# Patient Record
Sex: Female | Born: 1970 | Hispanic: Yes | Marital: Married | Smoking: Never smoker
Health system: Southern US, Community
[De-identification: ages and names within clinical notes are randomized; demographics above are authoritative.]

## PROBLEM LIST (undated history)

## (undated) DIAGNOSIS — I10 Essential (primary) hypertension: Secondary | ICD-10-CM

## (undated) DIAGNOSIS — E119 Type 2 diabetes mellitus without complications: Secondary | ICD-10-CM

---

## 2008-02-24 ENCOUNTER — Emergency Department: Payer: Self-pay | Admitting: Internal Medicine

## 2016-02-06 ENCOUNTER — Encounter: Payer: Self-pay | Admitting: *Deleted

## 2016-02-06 ENCOUNTER — Ambulatory Visit
Admission: RE | Admit: 2016-02-06 | Discharge: 2016-02-06 | Disposition: A | Discharge status: Home / Self Care | Payer: Self-pay | Source: Ambulatory Visit | Attending: Oncology | Admitting: Oncology

## 2016-02-06 ENCOUNTER — Ambulatory Visit: Payer: Self-pay | Attending: Oncology | Admitting: *Deleted

## 2016-02-06 VITALS — BP 110/72 | HR 75 | Temp 98.2°F | Ht 62.99 in | Wt 220.7 lb

## 2016-02-06 DIAGNOSIS — Z0000 Encounter for general adult medical examination without abnormal findings: Secondary | ICD-10-CM

## 2016-02-06 NOTE — Patient Instructions (Signed)
Gave patient hand-out, Women Staying Healthy, Active and Well from [facility], with education on breast health, pap smears, heart and colon health. 

## 2016-02-06 NOTE — Progress Notes (Signed)
Subjective:     Patient ID: Crystal Sawyer, female   DOB: 08/01/1970, 45 y.o.   MRN: [id number]  HPI   Review of Systems     Objective:   Physical Exam  Pulmonary/Chest: Edema: bilateral inverted nipples noted - patient states normal for her. Right breast exhibits inverted nipple. Right breast exhibits no mass, no nipple discharge, no skin change and no tenderness. Left breast exhibits inverted nipple. Left breast exhibits no mass, no nipple discharge, no skin change and no tenderness. Breasts are symmetrical.    Abdominal: There is no splenomegaly or hepatomegaly.       Assessment:     45 [facility] Hispanic [facility].  Pelvic exam omitted since she is still spotting from her menstrual cycle.  Lloyda the interpreter present during the interview and exam.  Clinical breast exam unremarkable.  Taught self breast awareness.  Patient has been screened for eligibility.  She does not have any insurance, Medicare or Medicaid.  She also meets financial eligibility.  Hand-out given on the Affordable Care Act.    Plan:     Screening mammogram ordered.  Will follow-up per [facility] protocol.

## 2016-02-08 ENCOUNTER — Encounter: Payer: Self-pay | Admitting: *Deleted

## 2016-02-08 NOTE — Progress Notes (Signed)
Letter mailed to inform patient of her normal mammogram results.  She is to follow-up in one year with annual screening.  HSIS to Christy. 

## 2017-01-28 ENCOUNTER — Ambulatory Visit: Payer: Self-pay

## 2017-02-28 ENCOUNTER — Encounter: Payer: Self-pay | Admitting: Emergency Medicine

## 2017-02-28 ENCOUNTER — Emergency Department
Admission: EM | Admit: 2017-02-28 | Discharge: 2017-03-01 | Disposition: A | Discharge status: Home / Self Care | Payer: No Typology Code available for payment source | Attending: Emergency Medicine | Admitting: Emergency Medicine

## 2017-02-28 DIAGNOSIS — R52 Pain, unspecified: Secondary | ICD-10-CM

## 2017-02-28 DIAGNOSIS — S0990XA Unspecified injury of head, initial encounter: Secondary | ICD-10-CM | POA: Insufficient documentation

## 2017-02-28 DIAGNOSIS — M542 Cervicalgia: Secondary | ICD-10-CM | POA: Insufficient documentation

## 2017-02-28 DIAGNOSIS — Y939 Activity, unspecified: Secondary | ICD-10-CM | POA: Diagnosis not present

## 2017-02-28 DIAGNOSIS — S2241XA Multiple fractures of ribs, right side, initial encounter for closed fracture: Secondary | ICD-10-CM | POA: Diagnosis not present

## 2017-02-28 DIAGNOSIS — R1011 Right upper quadrant pain: Secondary | ICD-10-CM | POA: Insufficient documentation

## 2017-02-28 DIAGNOSIS — Y929 Unspecified place or not applicable: Secondary | ICD-10-CM | POA: Diagnosis not present

## 2017-02-28 DIAGNOSIS — Y998 Other external cause status: Secondary | ICD-10-CM | POA: Insufficient documentation

## 2017-02-28 DIAGNOSIS — M25511 Pain in right shoulder: Secondary | ICD-10-CM | POA: Insufficient documentation

## 2017-02-28 DIAGNOSIS — E119 Type 2 diabetes mellitus without complications: Secondary | ICD-10-CM | POA: Diagnosis not present

## 2017-02-28 DIAGNOSIS — I10 Essential (primary) hypertension: Secondary | ICD-10-CM | POA: Insufficient documentation

## 2017-02-28 DIAGNOSIS — S299XXA Unspecified injury of thorax, initial encounter: Secondary | ICD-10-CM | POA: Diagnosis present

## 2017-02-28 HISTORY — DX: Type 2 diabetes mellitus without complications: E11.9

## 2017-02-28 HISTORY — DX: Essential (primary) hypertension: I10

## 2017-02-28 MED ORDER — FENTANYL CITRATE (PF) 100 MCG/2ML IJ SOLN
75.0000 ug | Freq: Once | INTRAMUSCULAR | Status: AC
  Administered 2017-03-01: 75 ug via INTRAVENOUS
  Filled 2017-02-28: qty 2
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
## 2017-03-01 ENCOUNTER — Encounter: Payer: Self-pay | Admitting: Radiology

## 2017-03-01 ENCOUNTER — Emergency Department: Payer: No Typology Code available for payment source

## 2017-03-01 LAB — CBC WITH DIFFERENTIAL/PLATELET
BASOS PCT: 0 %
Basophils Absolute: 0.1 10*3/uL (ref 0–0.1)
Eosinophils Absolute: 0.1 10*3/uL (ref 0–0.7)
Eosinophils Relative: 1 %
HEMATOCRIT: 43.5 % (ref 35.0–47.0)
HEMOGLOBIN: 13.9 g/dL (ref 12.0–16.0)
Lymphocytes Relative: 31 %
Lymphs Abs: 3.9 10*3/uL — ABNORMAL HIGH (ref 1.0–3.6)
MCH: 27.7 pg (ref 26.0–34.0)
MCHC: 32.1 g/dL (ref 32.0–36.0)
MCV: 86.2 fL (ref 80.0–100.0)
MONOS PCT: 5 %
Monocytes Absolute: 0.7 10*3/uL (ref 0.2–0.9)
NEUTROS ABS: 7.8 10*3/uL — AB (ref 1.4–6.5)
NEUTROS PCT: 63 %
Platelets: 339 10*3/uL (ref 150–440)
RBC: 5.04 MIL/uL (ref 3.80–5.20)
RDW: 14.8 % — ABNORMAL HIGH (ref 11.5–14.5)
WBC: 12.5 10*3/uL — ABNORMAL HIGH (ref 3.6–11.0)

## 2017-03-01 LAB — COMPREHENSIVE METABOLIC PANEL
ALT: 22 U/L (ref 14–54)
ANION GAP: 8 (ref 5–15)
AST: 30 U/L (ref 15–41)
Albumin: 3.7 g/dL (ref 3.5–5.0)
Alkaline Phosphatase: 94 U/L (ref 38–126)
BUN: 20 mg/dL (ref 6–20)
CALCIUM: 8.8 mg/dL — AB (ref 8.9–10.3)
CHLORIDE: 105 mmol/L (ref 101–111)
CO2: 22 mmol/L (ref 22–32)
Creatinine, Ser: 0.56 mg/dL (ref 0.44–1.00)
GFR calc non Af Amer: >60 mL/min (ref >60)
Glucose, Bld: 242 mg/dL — ABNORMAL HIGH (ref 65–99)
POTASSIUM: 3.5 mmol/L (ref 3.5–5.1)
SODIUM: 135 mmol/L (ref 135–145)
Total Bilirubin: 0.5 mg/dL (ref 0.3–1.2)
Total Protein: 7.8 g/dL (ref 6.5–8.1)

## 2017-03-01 LAB — HCG, QUANTITATIVE, PREGNANCY: hCG, Beta Chain, Quant, S: <1 m[IU]/mL (ref <5)

## 2017-03-01 MED ORDER — IBUPROFEN 600 MG PO TABS: 600.0000 mg | ORAL_TABLET | Freq: Three times a day (TID) | ORAL | 0 refills | Status: AC | PRN

## 2017-03-01 MED ORDER — HYDROCODONE-ACETAMINOPHEN 5-325 MG PO TABS
1.0000 | ORAL_TABLET | Freq: Once | ORAL | Status: AC
  Administered 2017-03-01: 1 via ORAL
  Filled 2017-03-01: qty 1

## 2017-03-01 MED ORDER — KETOROLAC TROMETHAMINE 30 MG/ML IJ SOLN
15.0000 mg | Freq: Once | INTRAMUSCULAR | Status: AC
  Administered 2017-03-01: 15 mg via INTRAVENOUS
  Filled 2017-03-01: qty 1

## 2017-03-01 MED ORDER — IOPAMIDOL (ISOVUE-370) INJECTION 76%
75.0000 mL | Freq: Once | INTRAVENOUS | Status: AC | PRN
  Administered 2017-03-01: 75 mL via INTRAVENOUS

## 2017-03-01 MED ORDER — HYDROCODONE-ACETAMINOPHEN 5-325 MG PO TABS: 1.0000 | ORAL_TABLET | Freq: Four times a day (QID) | ORAL | 0 refills | Status: AC | PRN

## 2017-03-01 NOTE — ED Notes (Signed)
From 2351 until this time, "interpreter on a stick" isn't working, machine keeps saying "thank you for calling [employer], please hold"

## 2017-03-01 NOTE — ED Notes (Signed)
Patient transported to CT 
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
## 2017-03-01 NOTE — ED Notes (Addendum)
Recall note to transport
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
## 2017-04-01 ENCOUNTER — Ambulatory Visit: Payer: No Typology Code available for payment source | Attending: Oncology

## 2018-04-14 ENCOUNTER — Ambulatory Visit: Payer: Self-pay

## 2018-07-28 ENCOUNTER — Ambulatory Visit: Payer: Self-pay

## 2018-09-13 ENCOUNTER — Telehealth: Payer: Self-pay | Admitting: *Deleted

## 2018-09-13 DIAGNOSIS — Z20822 Contact with and (suspected) exposure to covid-19: Secondary | ICD-10-CM

## 2018-09-13 NOTE — Telephone Encounter (Signed)
Pt referred for COVID-19 testing by [facility] Department.

## 2018-09-13 NOTE — Addendum Note (Signed)
Addended by: Dimple Nanas on: 09/13/2018 01:52 PM   Modules accepted: Orders

## 2018-09-13 NOTE — Telephone Encounter (Signed)
Appt scheduled using [place] ID# [phone] via pt's daughter Crystal Sawyer. Appt on 09/14/18 at the [facility] location.Advised to wear a mask and remain in car at appt time. Understanding verbalized.

## 2018-09-14 ENCOUNTER — Other Ambulatory Visit: Payer: Self-pay

## 2018-09-14 DIAGNOSIS — Z20822 Contact with and (suspected) exposure to covid-19: Secondary | ICD-10-CM

## 2018-09-19 LAB — NOVEL CORONAVIRUS, NAA: SARS-CoV-2, NAA: NOT DETECTED

## 2018-09-20 NOTE — Telephone Encounter (Signed)
Negative result was given to pt °

## 2018-09-22 ENCOUNTER — Telehealth: Payer: Self-pay | Admitting: Family Medicine

## 2018-09-22 NOTE — Telephone Encounter (Signed)
Attempted to reach pt with assist of interpreter, no VM set up.

## 2019-11-02 ENCOUNTER — Encounter: Payer: Self-pay | Admitting: *Deleted

## 2019-11-02 ENCOUNTER — Ambulatory Visit
Admission: RE | Admit: 2019-11-02 | Discharge: 2019-11-02 | Disposition: A | Discharge status: Home / Self Care | Payer: Self-pay | Source: Ambulatory Visit | Attending: Oncology | Admitting: Oncology

## 2019-11-02 ENCOUNTER — Other Ambulatory Visit: Payer: Self-pay

## 2019-11-02 ENCOUNTER — Ambulatory Visit: Payer: Self-pay

## 2019-11-02 ENCOUNTER — Ambulatory Visit: Payer: Self-pay | Attending: Oncology | Admitting: *Deleted

## 2019-11-02 VITALS — BP 133/90 | HR 78 | Temp 98.0°F | Ht 61.5 in | Wt 202.0 lb

## 2019-11-02 DIAGNOSIS — Z0000 Encounter for general adult medical examination without abnormal findings: Secondary | ICD-10-CM | POA: Insufficient documentation

## 2019-11-02 NOTE — Patient Instructions (Signed)
Gave patient hand-out, Women Staying Healthy, Active and Well from [facility], with education on breast health, pap smears, heart and colon health. 

## 2019-11-02 NOTE — Progress Notes (Signed)
  Subjective:     Patient ID: Crystal Sawyer, female   DOB: December 18, 1970, 49 y.o.   MRN: [id number]  HPI   [facility] Medical History Record - 11/02/19 0914      Breast History   Screening cycle Rescreen    CBE Date 02/06/16    Provider (CBE) [facility]    Initial Mammogram 11/02/19    Last Mammogram Annual    Last Mammogram Date 02/06/16    Provider (Mammogram)  Delford Field    Recent Breast Symptoms None      Breast Cancer History   Breast Cancer History No personal or family history      Previous History of Breast Problems   Breast Surgery or Biopsy None    Breast Implants N/A    BSE Done Monthly      Gynecological/Obstetrical History   LMP 10/02/19    Is there any chance that the client could be pregnant?  No    Age at menarche 49    Age at menopause n/a    PAP smear history Annually    Date of last PAP  07/23/17    Provider (PAP) Phine[facility] Real    Age at first live birth 49    Breast fed children No    DES Exposure Unkown    Cervical, Uterine or Ovarian cancer No    Family history of Cervial, Uterine or Ovarian cancer No    Hysterectomy No    Cervix removed No    Ovaries removed No    Laser/Cryosurgery No    Current method of birth control --   Copper IUD   Current method of Estrogen/Hormone replacement None    Smoking history None    Comments No Ins / 5 in [facility] / $1123/wk            Review of Systems     Objective:   Physical Exam Chest:     Breasts:        Right: No swelling, bleeding, inverted nipple, mass, nipple discharge, skin change or tenderness.        Left: No swelling, bleeding, inverted nipple, mass, nipple discharge, skin change or tenderness.    Lymphadenopathy:     Upper Body:     Right upper body: No supraclavicular or axillary adenopathy.     Left upper body: No supraclavicular or axillary adenopathy.        Assessment:     49 year old Hispanic female returns to [facility] for clinical breast exam and mammogram.  Crystal Sawyer, the interpreter  present during the interview and exam.  Clinical breast exam reveals bilateral inverted nipples.  Patient states this is normal for her. States she was not able to breast feed due to the inverted nipples.   Taught self breast awareness.  Last pap on 07/23/17 was negative without HPV co-testing.  Next pap due in 2022.  Patient has been screened for eligibility.  She does not have any insurance, Medicare or Medicaid.  She also meets financial eligibility.   Risk Assessment    Risk Scores      11/02/2019   Last edited by: Alta Corning, CMA   5-year risk: 0.8 %   Lifetime risk: 7.9 %            Plan:     Screening mammogram ordered.  Will follow up per [facility] protocol.

## 2019-11-03 ENCOUNTER — Encounter: Payer: Self-pay | Admitting: *Deleted

## 2019-11-03 NOTE — Progress Notes (Unsigned)
Letter mailed from the [facility] to inform patient of her normal mammogram results.  Patient is to follow-up with annual screening in one year. 

## 2021-04-23 ENCOUNTER — Other Ambulatory Visit: Payer: Self-pay

## 2021-04-23 DIAGNOSIS — Z1231 Encounter for screening mammogram for malignant neoplasm of breast: Secondary | ICD-10-CM

## 2021-05-21 ENCOUNTER — Ambulatory Visit: Payer: Self-pay

## 2021-08-19 ENCOUNTER — Other Ambulatory Visit: Payer: Self-pay

## 2021-08-19 ENCOUNTER — Encounter: Payer: Self-pay | Admitting: Primary Care

## 2021-08-19 DIAGNOSIS — Z1211 Encounter for screening for malignant neoplasm of colon: Secondary | ICD-10-CM

## 2021-08-28 ENCOUNTER — Ambulatory Visit: Payer: Self-pay | Attending: Hematology and Oncology | Admitting: *Deleted

## 2021-08-28 ENCOUNTER — Ambulatory Visit
Admission: RE | Admit: 2021-08-28 | Discharge: 2021-08-28 | Disposition: A | Discharge status: Home / Self Care | Payer: Self-pay | Source: Ambulatory Visit | Attending: Obstetrics and Gynecology | Admitting: Obstetrics and Gynecology

## 2021-08-28 VITALS — BP 155/92 | Wt 217.9 lb

## 2021-08-28 DIAGNOSIS — Z1239 Encounter for other screening for malignant neoplasm of breast: Secondary | ICD-10-CM

## 2021-08-28 DIAGNOSIS — Z1231 Encounter for screening mammogram for malignant neoplasm of breast: Secondary | ICD-10-CM | POA: Insufficient documentation

## 2021-08-28 NOTE — Progress Notes (Signed)
Ms. Crystal Sawyer is a 51 y.o. female who presents to [facility] clinic today with no complaints.  Pap Smear: Pap smear not completed today. Last Pap smear was 03/27/2021 at [facility] clinic and was normal per patient. Per patient has history of an abnormal Pap smear in 2008 that a colposcopy was completed for follow up 07/01/2006 that showed HPV effect. Last Pap smear result is not available in Epic.   Physical exam: Breasts Breasts symmetrical. No skin abnormalities bilateral breasts. Nipple retraction bilateral breasts that per patient is normal for her. No nipple discharge bilateral breasts. No lymphadenopathy. No lumps palpated bilateral breasts. No complaints of pain or tenderness on exam.    MS DIGITAL SCREENING TOMO BILATERAL  Result Date: 11/03/2019 CLINICAL DATA:  Screening. EXAM: DIGITAL SCREENING BILATERAL MAMMOGRAM WITH TOMO AND CAD COMPARISON:  None. ACR Breast Density Category a: The breast tissue is almost entirely fatty. FINDINGS: There are no findings suspicious for malignancy. Images were processed with CAD. IMPRESSION: No mammographic evidence of malignancy. A result letter of this screening mammogram will be mailed directly to the patient. RECOMMENDATION: Screening mammogram in one year. (Code:SM-B-01Y) BI-RADS CATEGORY  1: Negative. Electronically Signed   By: Frederico Hamman M.D.   On: 11/03/2019 14:46     Pelvic/Bimanual Pap is not indicated today per [facility] guidelines.   Smoking History: Patient has never smoked.   Patient Navigation: Patient education provided. Access to services provided for patient through [employer] program. Spanish interpreter Delos Haring from [facility] provided.   Colorectal Cancer Screening: Per patient had a colonoscopy completed in 2013. FIT Test given to patient to complete. No complaints today.    Breast and Cervical Cancer Risk Assessment: Patient does not have family history of breast cancer, known genetic mutations, or  radiation treatment to the chest before age 31. Patient has history of cervical dysplasia. Patient has no history of being immunocompromised or DES exposure in-utero.  Risk Assessment     Risk Scores       08/28/2021 11/02/2019   Last edited by: Narda Rutherford, LPN [place], [place], [place]   5-year risk: 0.8 % 0.8 %   Lifetime risk: 7.7 % 7.9 %            A: [facility] exam without pap smear No complaints.  P: Referred patient to the [facility] for a screening mammogram. Appointment scheduled Wednesday, August 28, 2021 at 1240.  Priscille Heidelberg, RN 08/28/2021 12:09 PM

## 2021-08-28 NOTE — Patient Instructions (Signed)
Explained breast self awareness with Dimas Millin. Patient did not need a Pap smear today due to last Pap smear was 03/27/2021 per patient. Let her know [facility] will cover Pap smears every 3 years unless has a history of abnormal Pap smears. Referred patient to the [facility] for a screening mammogram. Appointment scheduled Wednesday, August 28, 2021 at 1240. Patient aware of appointment and will be there. Let patient know Delford Field will follow up with her within the next couple weeks with results of her mammogram by letter or phone. Diem Haliey Romberg verbalized understanding.  Gursimran Litaker, Kathaleen Maser, RN 12:09 PM

## 2021-08-28 NOTE — Addendum Note (Signed)
Addended by: Narda Rutherford on: 08/28/2021 12:07 PM   Modules accepted: Orders

## 2022-11-05 ENCOUNTER — Other Ambulatory Visit: Payer: Self-pay

## 2022-11-05 ENCOUNTER — Other Ambulatory Visit (LOCAL_COMMUNITY_HEALTH_CENTER): Payer: Self-pay

## 2022-11-05 DIAGNOSIS — Z201 Contact with and (suspected) exposure to tuberculosis: Secondary | ICD-10-CM

## 2022-11-05 LAB — HM HIV SCREENING LAB: HM HIV Screening: NEGATIVE

## 2022-11-05 NOTE — Progress Notes (Signed)
Patient is a 52 yo female being evaluated for TB.  Patient is a close contact to an active pulmonary TB case. QFT and HIV drawn today.   11/05/2022 - QFT pending 11/05/2022 - HIV Pending 11/05/2022 - EPI completed     EPI completed today. Patient states she is asymptomatic, has HTN, DM and high cholesterol. Takes lisinopril, metformin, and Jardiance.    PCP:  Phineas Real [facility]

## 2022-11-09 LAB — QUANTIFERON-TB GOLD PLUS
QuantiFERON Mitogen Value: >10 [IU]/mL
QuantiFERON Nil Value: 0.13 [IU]/mL
QuantiFERON TB1 Ag Value: 0.17 [IU]/mL
QuantiFERON TB2 Ag Value: 0.14 [IU]/mL
QuantiFERON-TB Gold Plus: NEGATIVE

## 2023-02-24 ENCOUNTER — Other Ambulatory Visit: Payer: Self-pay

## 2023-02-24 DIAGNOSIS — Z111 Encounter for screening for respiratory tuberculosis: Secondary | ICD-10-CM

## 2023-02-24 NOTE — Progress Notes (Signed)
Patient in clinic for repeat QFT. Contact to TB. Richmond Campbell, RN

## 2023-02-28 LAB — QUANTIFERON-TB GOLD PLUS
QuantiFERON Mitogen Value: >10 [IU]/mL
QuantiFERON Nil Value: 0.13 [IU]/mL
QuantiFERON TB1 Ag Value: 0.13 [IU]/mL
QuantiFERON TB2 Ag Value: 0.13 [IU]/mL
QuantiFERON-TB Gold Plus: NEGATIVE

## 2023-08-15 IMAGING — MG MM DIGITAL SCREENING BILAT W/ TOMO AND CAD
8 series · 8 of 24 positions shown · non-contrast
Comparison: Previous exam(s).

ACR Breast Density Category a: The breast tissue is almost entirely
fatty.

CLINICAL DATA: Screening.

EXAM:
DIGITAL SCREENING BILATERAL MAMMOGRAM WITH TOMOSYNTHESIS AND CAD
TECHNIQUE: Bilateral screening digital craniocaudal and mediolateral oblique
mammograms were obtained. Bilateral screening digital breast
tomosynthesis was performed. The images were evaluated with
computer-aided detection.

[L MLO synth-2D]
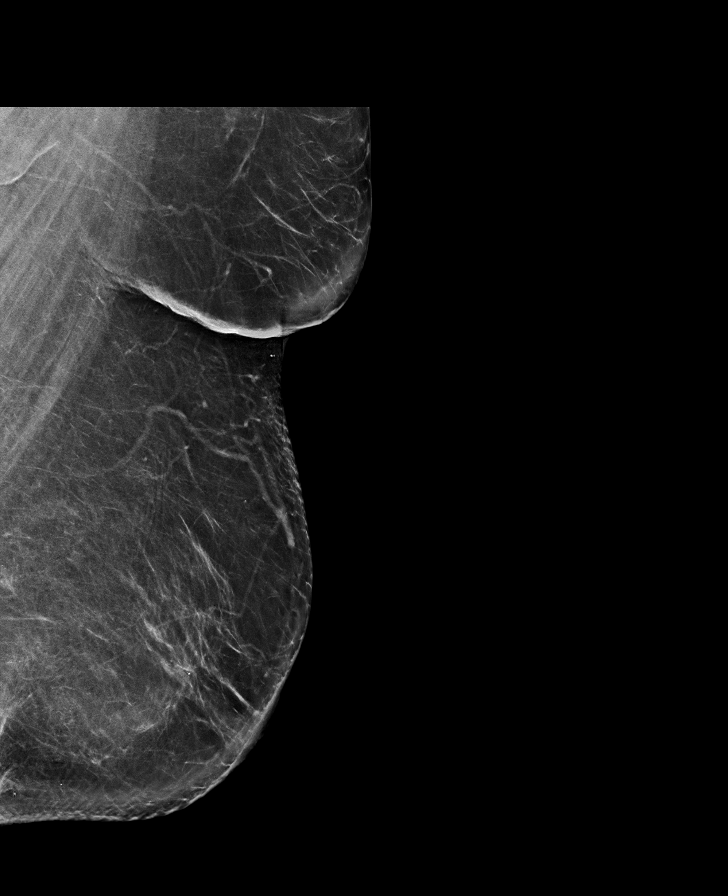

[R CC synth-2D]
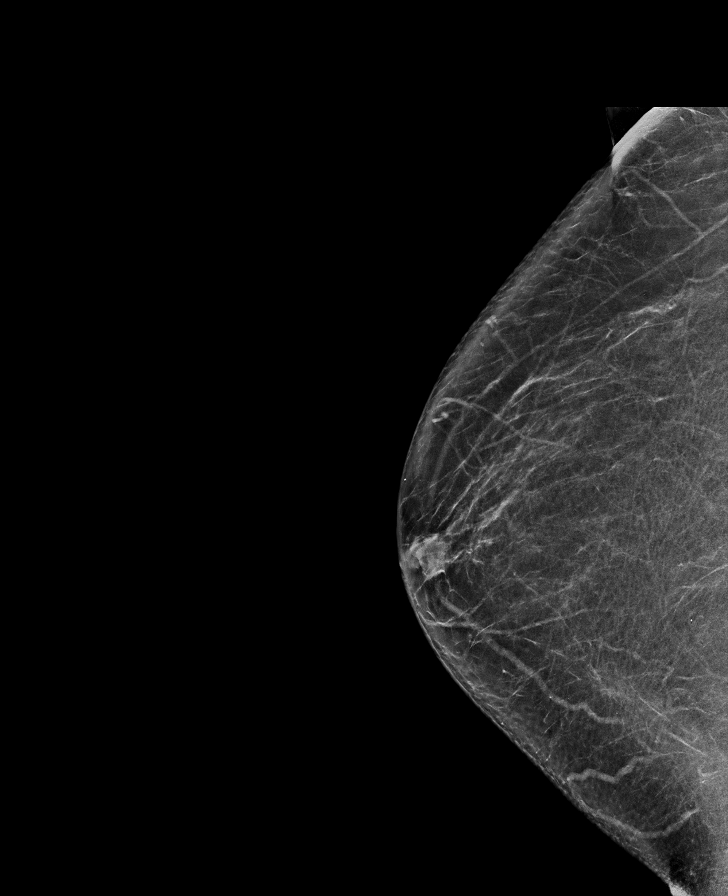

[L CC synth-2D]
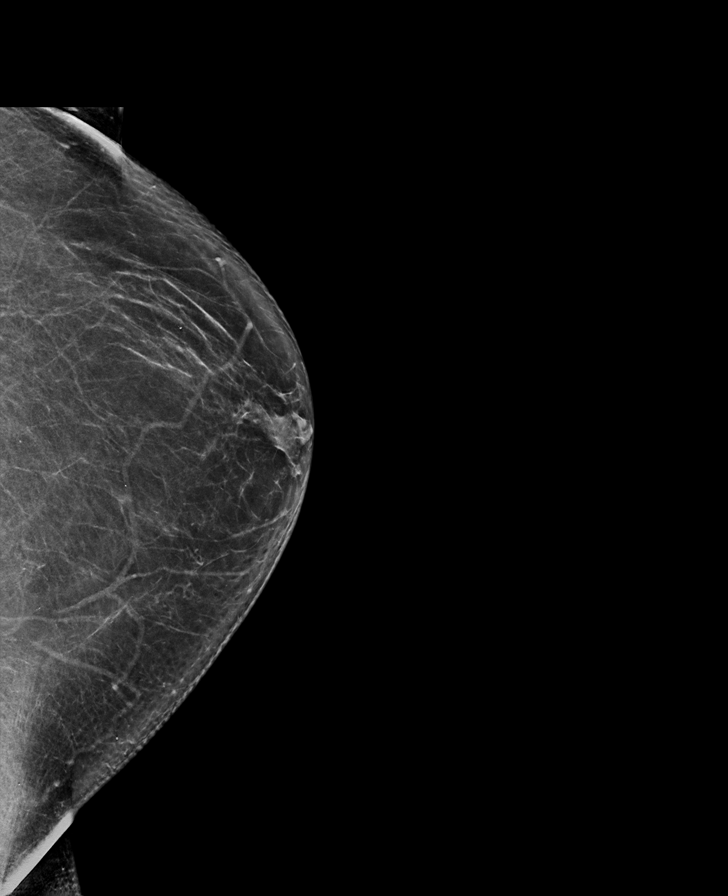

[R MLO synth-2D]
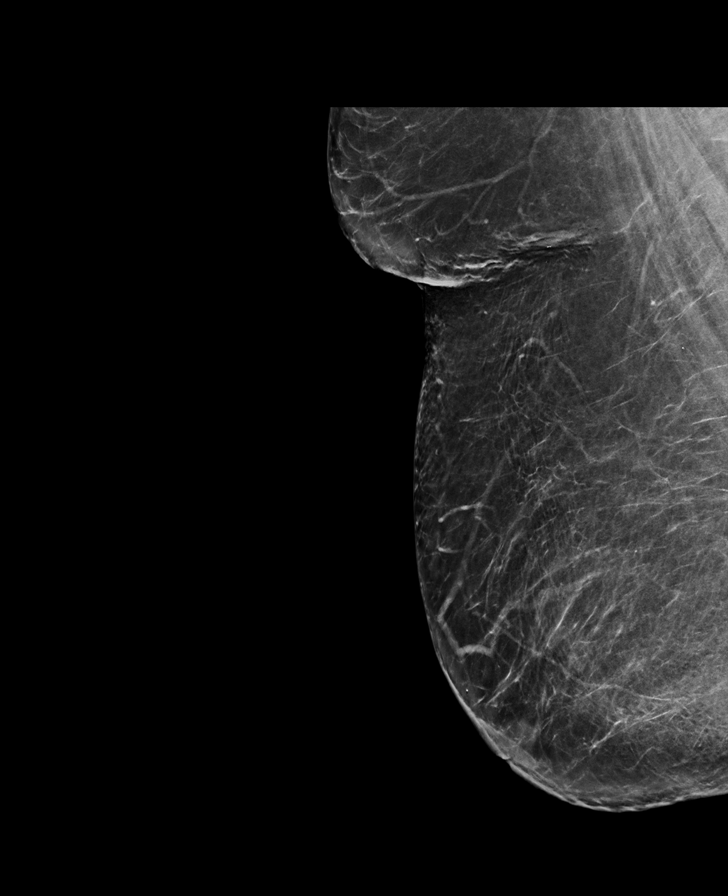

[L CC tomo · tomo slice 41/81.0]
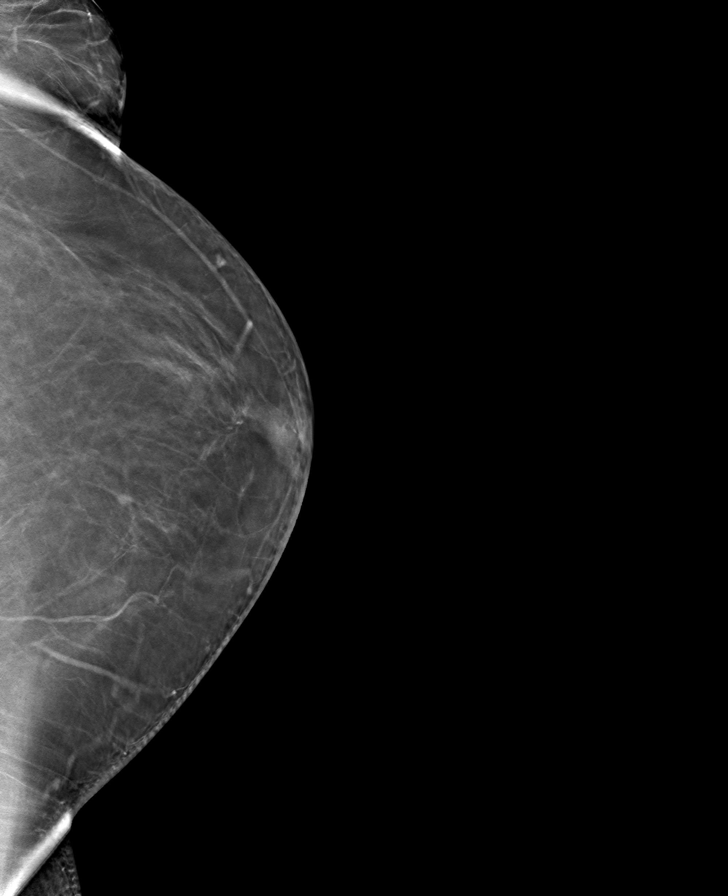

[L MLO tomo · tomo slice 43/84.0]
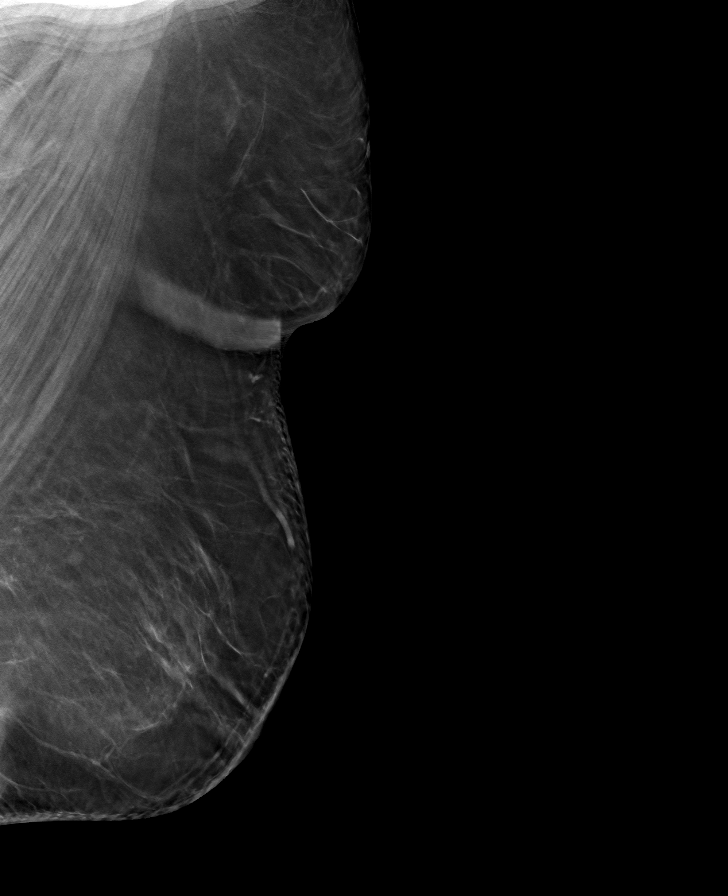

[R MLO tomo · tomo slice 41/81.0]
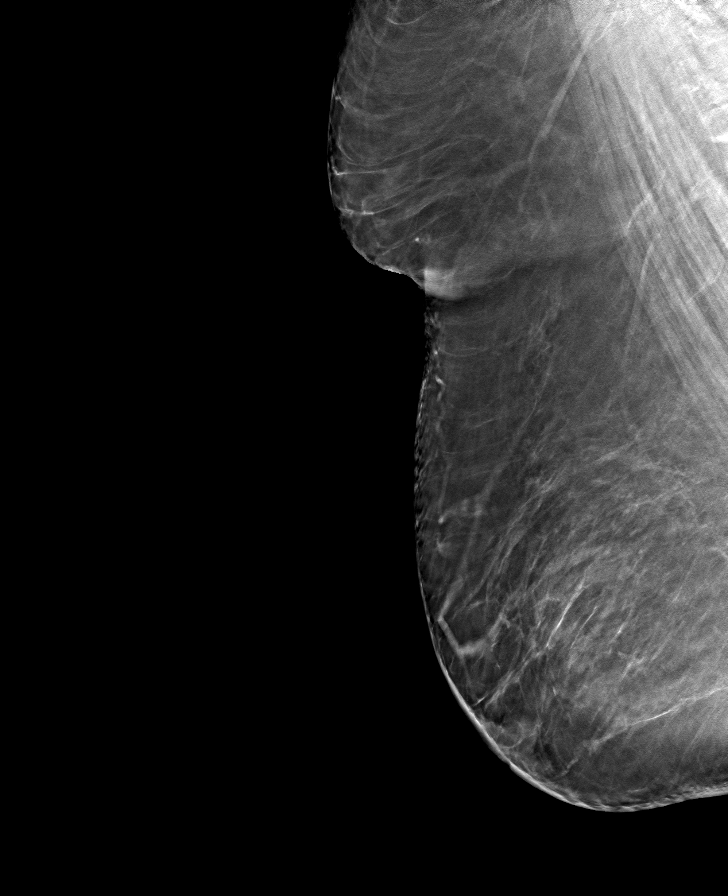

[R CC tomo · tomo slice 37/73.0]
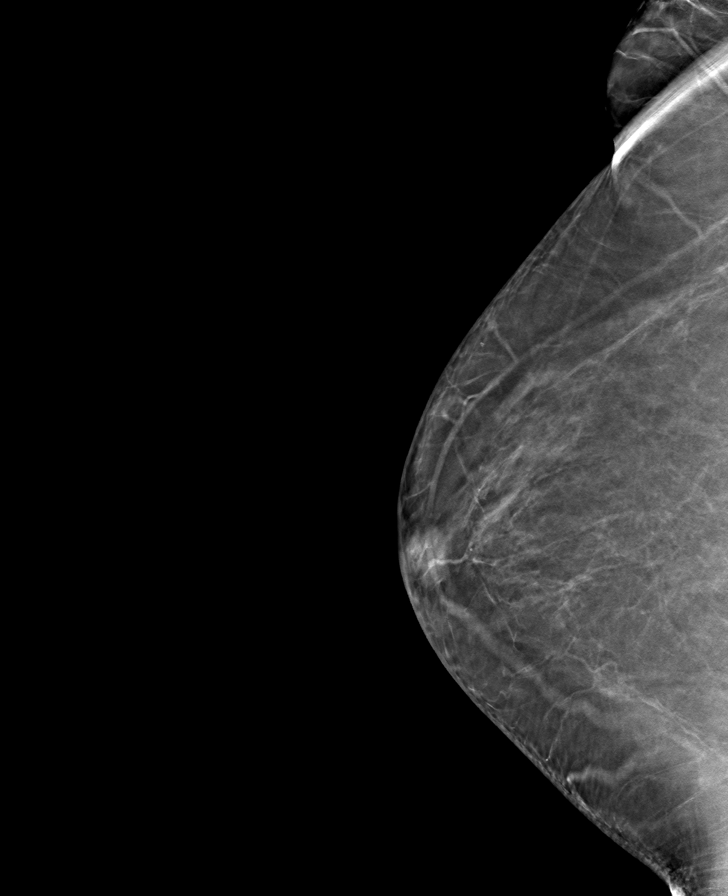

[8 of 24 positions shown; findings below may reference images not displayed]

FINDINGS: There are no findings suspicious for malignancy.
IMPRESSION: No mammographic evidence of malignancy. A result letter of this
screening mammogram will be mailed directly to the patient.

RECOMMENDATION:
Screening mammogram in one year. (Code:0E-3-N98)

BI-RADS CATEGORY  1: Negative.
